# Patient Record
Sex: Male | Born: 1960 | Race: White | Hispanic: No | Marital: Married
Health system: Southern US, Community
[De-identification: ages and names within clinical notes are randomized; demographics above are authoritative.]

---

## 2018-11-21 ENCOUNTER — Emergency Department (HOSPITAL_COMMUNITY): Payer: BC Managed Care – PPO

## 2018-11-21 ENCOUNTER — Emergency Department (HOSPITAL_COMMUNITY)
Admission: EM | Admit: 2018-11-21 | Discharge: 2018-11-21 | Disposition: A | Discharge status: Home / Self Care | Payer: BC Managed Care – PPO | Attending: Emergency Medicine | Admitting: Emergency Medicine

## 2018-11-21 ENCOUNTER — Encounter (HOSPITAL_COMMUNITY): Payer: Self-pay | Admitting: Emergency Medicine

## 2018-11-21 ENCOUNTER — Other Ambulatory Visit: Payer: Self-pay

## 2018-11-21 DIAGNOSIS — K644 Residual hemorrhoidal skin tags: Secondary | ICD-10-CM | POA: Insufficient documentation

## 2018-11-21 DIAGNOSIS — K6289 Other specified diseases of anus and rectum: Secondary | ICD-10-CM

## 2018-11-21 DIAGNOSIS — K649 Unspecified hemorrhoids: Secondary | ICD-10-CM

## 2018-11-21 LAB — CBC WITH DIFFERENTIAL/PLATELET
Abs Immature Granulocytes: 0.12 10*3/uL — ABNORMAL HIGH (ref 0.00–0.07)
Basophils Absolute: 0.1 10*3/uL (ref 0.0–0.1)
Basophils Relative: 1 %
Eosinophils Absolute: 0.1 10*3/uL (ref 0.0–0.5)
Eosinophils Relative: 1 %
HCT: 42.1 % (ref 39.0–52.0)
Hemoglobin: 14.2 g/dL (ref 13.0–17.0)
Immature Granulocytes: 1 %
Lymphocytes Relative: 12 %
Lymphs Abs: 1.4 10*3/uL (ref 0.7–4.0)
MCH: 33.3 pg (ref 26.0–34.0)
MCHC: 33.7 g/dL (ref 30.0–36.0)
MCV: 98.8 fL (ref 80.0–100.0)
Monocytes Absolute: 1.3 10*3/uL — ABNORMAL HIGH (ref 0.1–1.0)
Monocytes Relative: 11 %
Neutro Abs: 8.6 10*3/uL — ABNORMAL HIGH (ref 1.7–7.7)
Neutrophils Relative %: 74 %
Platelets: 307 10*3/uL (ref 150–400)
RBC: 4.26 MIL/uL (ref 4.22–5.81)
RDW: 14.3 % (ref 11.5–15.5)
WBC: 11.6 10*3/uL — ABNORMAL HIGH (ref 4.0–10.5)
nRBC: 0 % (ref 0.0–0.2)

## 2018-11-21 LAB — LIPASE, BLOOD: Lipase: 41 U/L (ref 11–51)

## 2018-11-21 LAB — URINALYSIS, ROUTINE W REFLEX MICROSCOPIC
Bilirubin Urine: NEGATIVE
Glucose, UA: NEGATIVE mg/dL
Hgb urine dipstick: NEGATIVE
Ketones, ur: NEGATIVE mg/dL
Leukocytes,Ua: NEGATIVE
Nitrite: NEGATIVE
Protein, ur: NEGATIVE mg/dL
Specific Gravity, Urine: 1.035 — ABNORMAL HIGH (ref 1.005–1.030)
pH: 6 (ref 5.0–8.0)

## 2018-11-21 LAB — POC OCCULT BLOOD, ED: Fecal Occult Bld: POSITIVE — AB

## 2018-11-21 LAB — COMPREHENSIVE METABOLIC PANEL
ALT: 41 U/L (ref 0–44)
AST: 25 U/L (ref 15–41)
Albumin: 3.9 g/dL (ref 3.5–5.0)
Alkaline Phosphatase: 54 U/L (ref 38–126)
Anion gap: 8 (ref 5–15)
BUN: 12 mg/dL (ref 6–20)
CO2: 25 mmol/L (ref 22–32)
Calcium: 9 mg/dL (ref 8.9–10.3)
Chloride: 103 mmol/L (ref 98–111)
Creatinine, Ser: 1.13 mg/dL (ref 0.61–1.24)
GFR calc Af Amer: >60 mL/min (ref >60)
GFR calc non Af Amer: >60 mL/min (ref >60)
Glucose, Bld: 140 mg/dL — ABNORMAL HIGH (ref 70–99)
Potassium: 3.9 mmol/L (ref 3.5–5.1)
Sodium: 136 mmol/L (ref 135–145)
Total Bilirubin: 0.9 mg/dL (ref 0.3–1.2)
Total Protein: 6.5 g/dL (ref 6.5–8.1)

## 2018-11-21 MED ORDER — HYDROCORTISONE ACETATE 25 MG RE SUPP
25.0000 mg | Freq: Once | RECTAL | Status: AC
  Administered 2018-11-21: 25 mg via RECTAL
  Filled 2018-11-21: qty 1

## 2018-11-21 MED ORDER — ACETAMINOPHEN 500 MG PO TABS: 500.0000 mg | ORAL_TABLET | Freq: Four times a day (QID) | ORAL | 0 refills | Status: AC | PRN

## 2018-11-21 MED ORDER — IOHEXOL 300 MG/ML  SOLN
100.0000 mL | Freq: Once | INTRAMUSCULAR | Status: AC | PRN
  Administered 2018-11-21: 100 mL via INTRAVENOUS

## 2018-11-21 MED ORDER — HYDROCORTISONE ACETATE 25 MG RE SUPP: 25.0000 mg | Freq: Two times a day (BID) | RECTAL | 0 refills | Status: AC

## 2018-11-21 NOTE — ED Notes (Signed)
Patient verbalizes understanding of discharge instructions. Opportunity for questioning and answers were provided. Armband removed by staff, pt discharged from ED ambulatory with [facility].

## 2018-11-21 NOTE — ED Provider Notes (Signed)
[facility] Note   CSN: [id number] Arrival date & time: 11/21/18  1736     History   Chief Complaint No chief complaint on file.   HPI Bryan Goodwin is a 58 y.o. male.     The history is provided by the patient. No language interpreter was used.     58 year old male brought here via EMS from [facility] for evaluation of rectal pain.  Patient report for the past several days he has had intermittent discomfort about his rectum.  States when he has bowel movement, he experienced significant burning pain about the rectum with associated itchiness.  On occasion he would have sharp shooting pain that happen sporadically about the rectum.  He is normally fairly regular with his bowel regimen.  He did take some laxative 2 days ago and subsequently had diarrhea.  Did not report any significant pain to his BM.  Today, he endorsed increasing rectal pain that happened on a periodic basis.  Does not complain of any fever lightheadedness dizziness chest pain shortness of breath productive cough dysuria rectal bleeding or mucus in stool.  He denies feeling constipated and also denies any recent opiate use.  He is currently at a rehab facility for alcohol abuse.  He has been alcohol free for the past 2 weeks.  No history of rectal cancer.  Patient states last night he was having difficulty sleeping due to his rectal discomfort.  He was using Tucks pads throughout the night without adequate relief.  History reviewed. No pertinent past medical history.  There are no active problems to display for this patient.   History reviewed. No pertinent surgical history.      Home Medications    Prior to Admission medications   Not on File    Family History No family history on file.  Social History Social History   Tobacco Use  . Smoking status: Not on file  Substance Use Topics  . Alcohol use: Not on file  . Drug use: Not on file      Allergies   Patient has no allergy information on record.   Review of Systems Review of Systems  All other systems reviewed and are negative.    Physical Exam Updated Vital Signs BP 139/84 (BP Location: Right Arm)   Pulse 76   Temp 98.7 F (37.1 C) (Oral)   Resp 16   Ht 6\' 2"  (1.88 m)   Wt 117.9 kg   SpO2 97%   BMI 33.38 kg/m   Physical Exam Vitals signs and nursing note reviewed.  Constitutional:      General: He is not in acute distress.    Appearance: He is well-developed.  HENT:     Head: Atraumatic.  Eyes:     Conjunctiva/sclera: Conjunctivae normal.  Neck:     Musculoskeletal: Neck supple.  Abdominal:     General: Abdomen is flat.     Palpations: Abdomen is soft.     Tenderness: There is no abdominal tenderness.  Genitourinary:    Comments: Chaperone present during exam.  Normal rectal tone, evidence of external hemorrhoid, nonthrombosed with localized skin irritation to the perianal region.  Exquisite tenderness with digital rectal exam therefore exam is limited but no obvious mass and no blood noted. Skin:    Findings: Bruising (Multiple large ecchymosis noted about the legs, thighs, and abdominal wall) present. No rash.  Neurological:     Mental Status: He is alert.  ED Treatments / Results  Labs (all labs ordered are listed, but only abnormal results are displayed) Labs Reviewed  CBC WITH DIFFERENTIAL/PLATELET - Abnormal; Notable for the following components:      Result Value   WBC 11.6 (*)    Neutro Abs 8.6 (*)    Monocytes Absolute 1.3 (*)    Abs Immature Granulocytes 0.12 (*)    All other components within normal limits  COMPREHENSIVE METABOLIC PANEL - Abnormal; Notable for the following components:   Glucose, Bld 140 (*)    All other components within normal limits  URINALYSIS, ROUTINE W REFLEX MICROSCOPIC - Abnormal; Notable for the following components:   Specific Gravity, Urine 1.035 (*)    All other components within normal  limits  POC OCCULT BLOOD, ED - Abnormal; Notable for the following components:   Fecal Occult Bld POSITIVE (*)    All other components within normal limits  LIPASE, BLOOD    EKG None  Radiology Ct Abdomen Pelvis W Contrast  Result Date: 11/21/2018 CLINICAL DATA:  Constipation pain EXAM: CT ABDOMEN AND PELVIS WITH CONTRAST TECHNIQUE: Multidetector CT imaging of the abdomen and pelvis was performed using the standard protocol following bolus administration of intravenous contrast. CONTRAST:  100mL OMNIPAQUE IOHEXOL 300 MG/ML  SOLN COMPARISON:  None. FINDINGS: Lower chest: Lung bases demonstrate no acute consolidation or effusion. Mild cardiomegaly. Hepatobiliary: Hepatic steatosis. No calcified gallstone or biliary dilatation Pancreas: Unremarkable. No pancreatic ductal dilatation or surrounding inflammatory changes. Spleen: Normal in size without focal abnormality. Adrenals/Urinary Tract: Adrenal glands are normal. Prominent left extrarenal pelvis. No ureteral stone. Cyst lower pole right kidney. Punctate stones within the lower poles of both kidneys. Mild posterior bladder wall thickening. Stomach/Bowel: Stomach within normal limits. No dilated small bowel. Negative appendix. No bowel wall thickening. Vascular/Lymphatic: Moderate aortic atherosclerosis. No aneurysm. No significantly enlarged lymph nodes Reproductive: Prostate is slightly enlarged. Other: Negative for free air or free fluid. Fat within the inguinal canals. Musculoskeletal: No acute or suspicious abnormality IMPRESSION: 1. No CT evidence for acute intra-abdominal or pelvic abnormality. 2. Hepatic steatosis 3. Intrarenal stones without hydronephrosis or ureteral stone Electronically Signed   By: Jasmine PangKim  Fujinaga M.D.   On: 11/21/2018 19:43    Procedures Procedures (including critical care time)  Medications Ordered in ED Medications  hydrocortisone (ANUSOL-HC) suppository 25 mg (25 mg Rectal Given 11/21/18 1857)  iohexol (OMNIPAQUE)  300 MG/ML solution 100 mL (100 mLs Intravenous Contrast Given 11/21/18 1919)     Initial Impression / Assessment and Plan / ED Course  I have reviewed the triage vital signs and the nursing notes.  Pertinent labs & imaging results that were available during my care of the patient were reviewed by me and considered in my medical decision making (see chart for details).        BP 139/84 (BP Location: Right Arm)   Pulse 76   Temp 98.7 F (37.1 C) (Oral)   Resp 16   Ht 6\' 2"  (1.88 m)   Wt 117.9 kg   SpO2 97%   BMI 33.38 kg/m    Final Clinical Impressions(s) / ED Diagnoses   Final diagnoses:  Rectal pain  Hemorrhoids, unspecified hemorrhoid type    ED Discharge Orders         Ordered    hydrocortisone (ANUSOL-HC) 25 MG suppository  2 times daily     11/21/18 2025    acetaminophen (TYLENOL) 500 MG tablet  Every 6 hours PRN     11/21/18 2025  6:28 PM Patient here with discomfort to his perianal region.  I suspect this is likely secondary to irritative external hemorrhoid.  No obvious signs of infection noted on exam however patient exhibits exquisite tenderness to digital rectal exam.  Given his tenderness, will obtain abdominal pelvic CT scan to rule out internal pathology.  Anusol given.  8:26 PM Urine without signs of urinary tract infection, fecal occult blood test is positive, mild elevated white count of 11.6, CT scan of the abdomen and pelvis without any acute finding.  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  Will prescribe Tylenol for pain as well as Anusol suppository for symptomatic treatment.  Encourage patient to follow-up with PCP for further care.  Return precaution discussed.   Domenic Moras, PA-C 11/21/18 2028    Hayden Rasmussen, MD 11/22/18 [phone]

## 2018-11-21 NOTE — ED Triage Notes (Signed)
[facility]- pt from [facility]. Pt here for constipation and rectal pain. Pt has had constipation for 1 week. BM today with meds given to him from [facility]. Pt now concerned there is damage in his rectum. Pt states he has severe rectum pain when he uses the bathroom

## 2020-11-27 IMAGING — CT CT ABDOMEN AND PELVIS WITH CONTRAST
2 of 5 series · 17 of 46 positions shown, 19 images · IV contrast (omnipaque)
Comparison: None.

CLINICAL DATA: Constipation pain

EXAM:
CT ABDOMEN AND PELVIS WITH CONTRAST
TECHNIQUE: Multidetector CT imaging of the abdomen and pelvis was performed
using the standard protocol following bolus administration of
intravenous contrast.
CONTRAST:  100mL OMNIPAQUE IOHEXOL 300 MG/ML  SOLN

[Series 3: abd/ pelvis 5.0 i30f 2 · axial · 0.98mm/px · z∈[+666,+1176]mm · 14 of 114 slices shown, 16 images]
[im 6/114  soft-tissue]
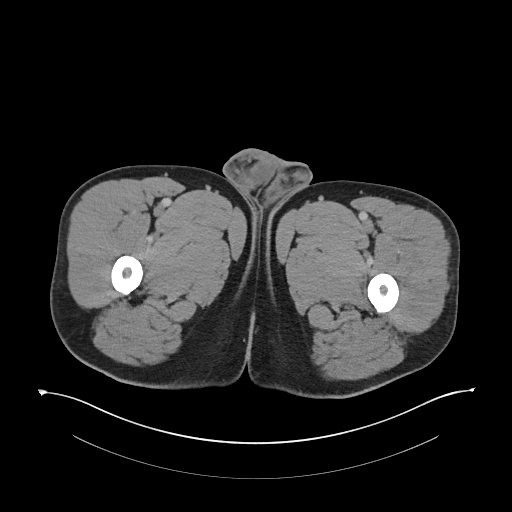
[im 6/114  bone]
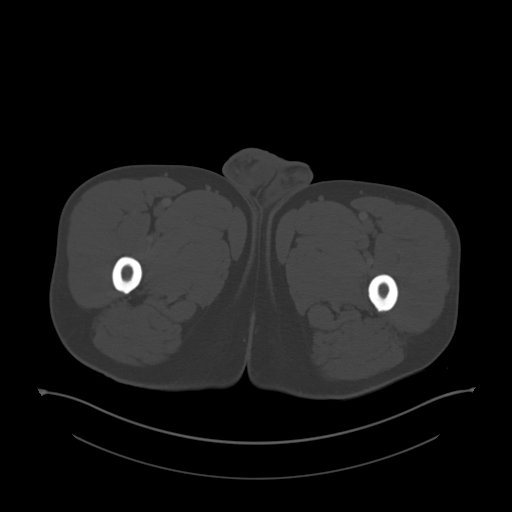
[im 17/114  soft-tissue]
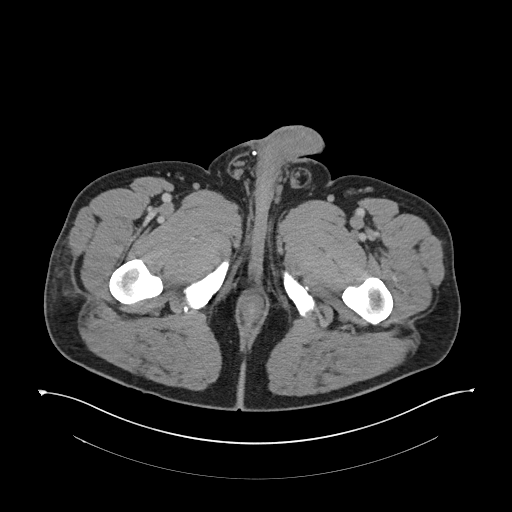
[im 23/114  soft-tissue]
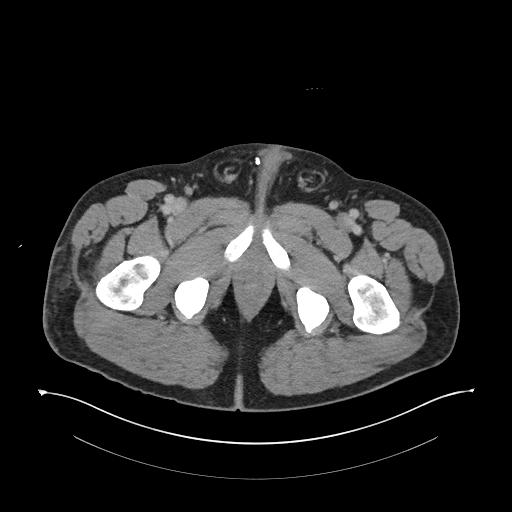
[im 29/114  soft-tissue]
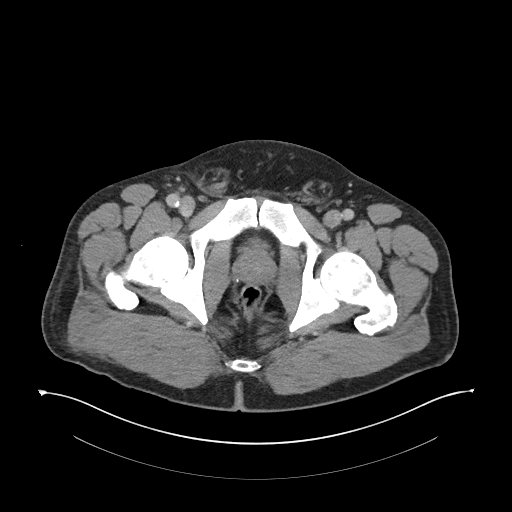
[im 40/114  soft-tissue]
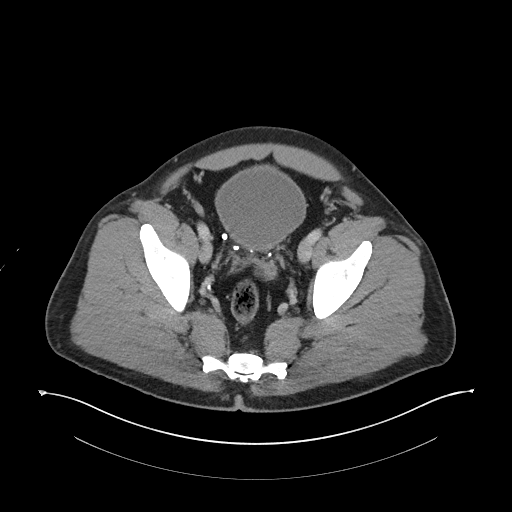
[im 46/114  soft-tissue]
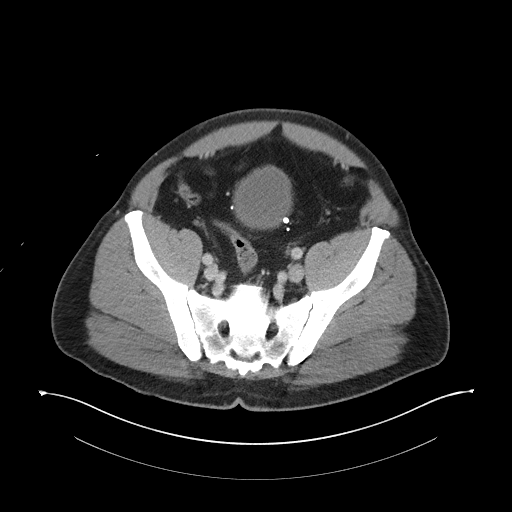
[im 51/114  soft-tissue]
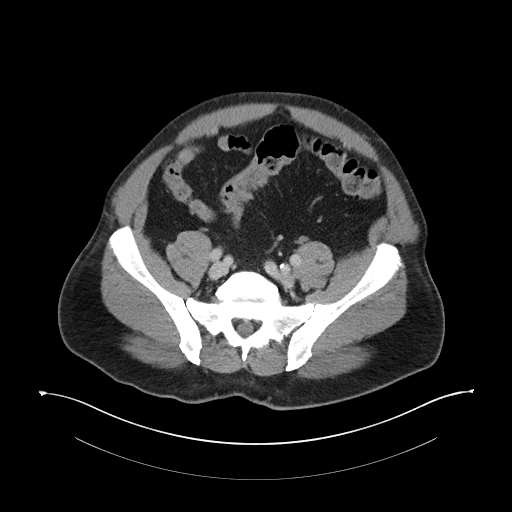
[im 63/114  soft-tissue]
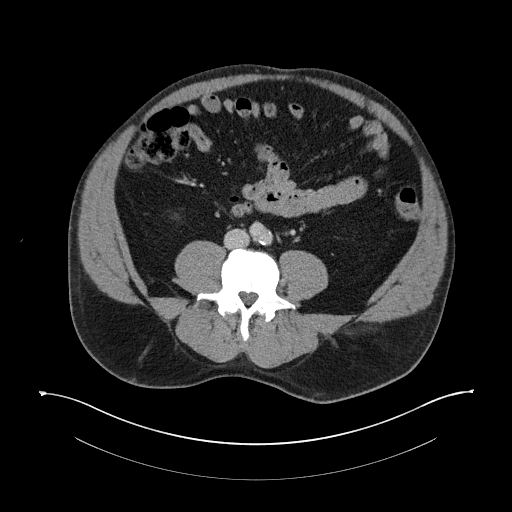
[im 68/114  soft-tissue]
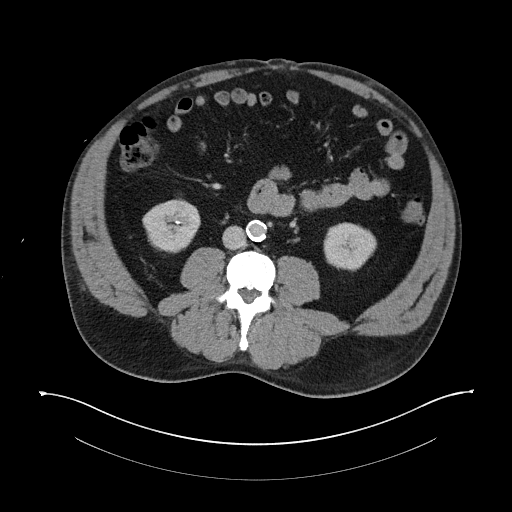
[im 68/114  bone]
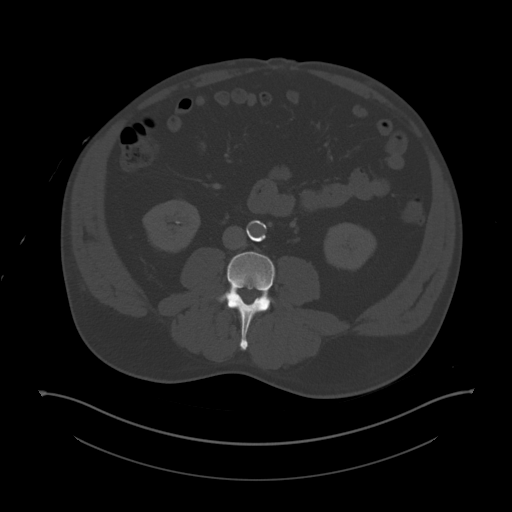
[im 74/114  soft-tissue]
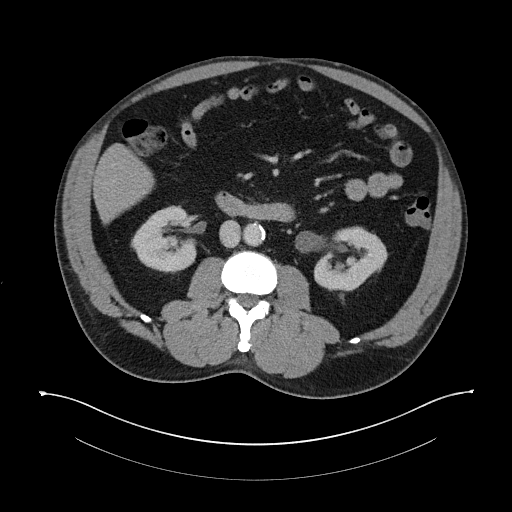
[im 85/114  soft-tissue]
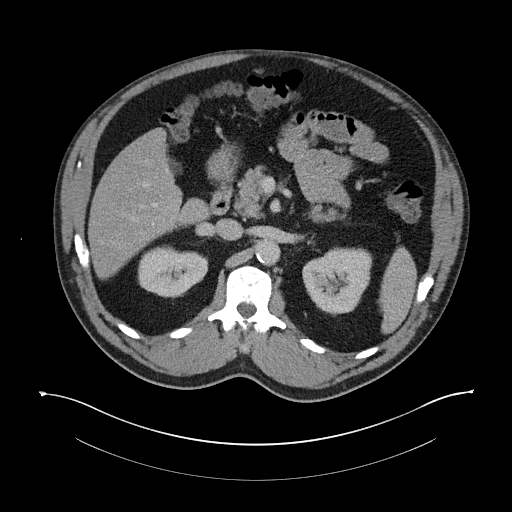
[im 91/114  soft-tissue]
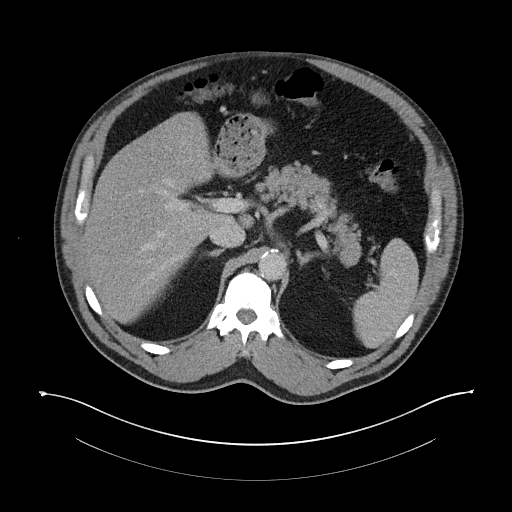
[im 97/114  soft-tissue]
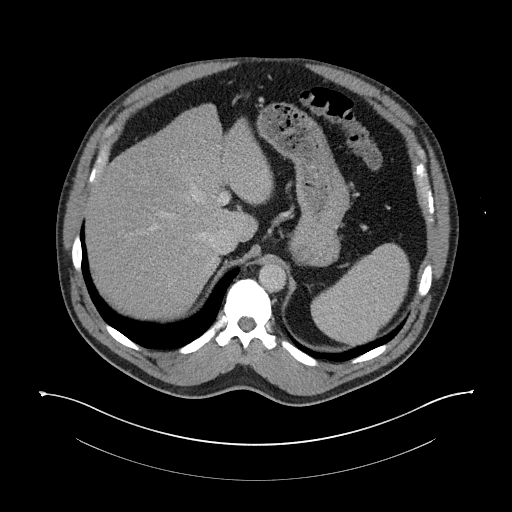
[im 108/114  soft-tissue]
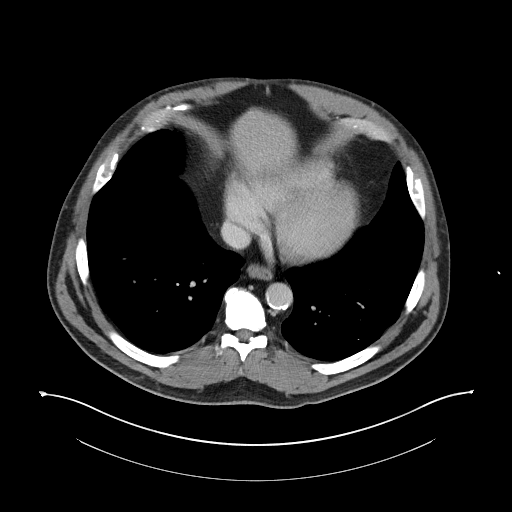

[Series 6: coronal soft tissue · coronal · 1.01mm/px · 3 of 134 slices shown]
[im 45/134  soft-tissue]
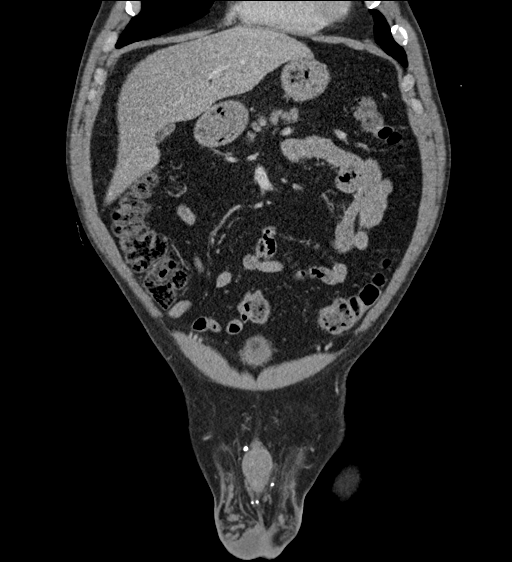
[im 60/134  soft-tissue]
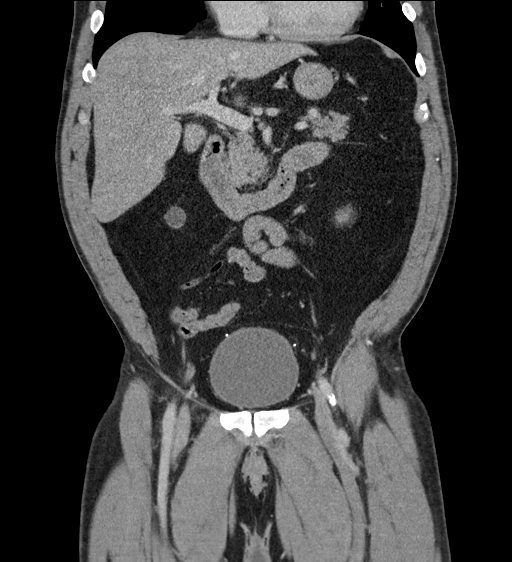
[im 74/134  soft-tissue]
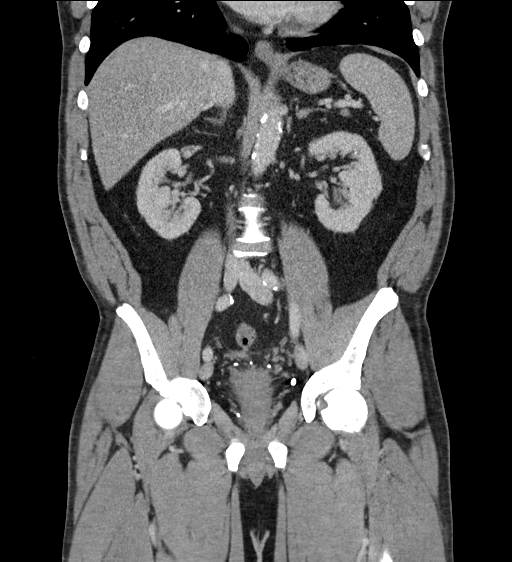

[17 of 46 positions shown; findings below may reference images not displayed]

FINDINGS: Lower chest: Lung bases demonstrate no acute consolidation or
effusion. Mild cardiomegaly.

Hepatobiliary: Hepatic steatosis. No calcified gallstone or biliary
dilatation

Pancreas: Unremarkable. No pancreatic ductal dilatation or
surrounding inflammatory changes.

Spleen: Normal in size without focal abnormality.

Adrenals/Urinary Tract: Adrenal glands are normal. Prominent left
extrarenal pelvis. No ureteral stone. Cyst lower pole right kidney.
Punctate stones within the lower poles of both kidneys. Mild
posterior bladder wall thickening.

Stomach/Bowel: Stomach within normal limits. No dilated small bowel.
Negative appendix. No bowel wall thickening.

Vascular/Lymphatic: Moderate aortic atherosclerosis. No aneurysm. No
significantly enlarged lymph nodes

Reproductive: Prostate is slightly enlarged.

Other: Negative for free air or free fluid. Fat within the inguinal
canals.

Musculoskeletal: No acute or suspicious abnormality
IMPRESSION: 1. No CT evidence for acute intra-abdominal or pelvic abnormality.
2. Hepatic steatosis
3. Intrarenal stones without hydronephrosis or ureteral stone
